# Patient Record
Sex: Male | Born: 1960 | Race: White | Marital: Married | Smoking: Never smoker
Health system: Southern US, Community
[De-identification: ages and names within clinical notes are randomized; demographics above are authoritative.]

## PROBLEM LIST (undated history)

## (undated) DIAGNOSIS — B882 Other arthropod infestations: Secondary | ICD-10-CM

---

## 2016-01-11 ENCOUNTER — Emergency Department
Admission: EM | Admit: 2016-01-11 | Discharge: 2016-01-11 | Disposition: A | Discharge status: Home / Self Care | Payer: 59 | Attending: Emergency Medicine | Admitting: Emergency Medicine

## 2016-01-11 ENCOUNTER — Encounter: Payer: Self-pay | Admitting: *Deleted

## 2016-01-11 ENCOUNTER — Emergency Department: Payer: 59

## 2016-01-11 DIAGNOSIS — R569 Unspecified convulsions: Secondary | ICD-10-CM | POA: Diagnosis present

## 2016-01-11 DIAGNOSIS — R51 Headache: Secondary | ICD-10-CM | POA: Insufficient documentation

## 2016-01-11 HISTORY — DX: Other arthropod infestations: B88.2

## 2016-01-11 LAB — COMPREHENSIVE METABOLIC PANEL
ALBUMIN: 4.1 g/dL (ref 3.5–5.0)
ALT: 19 U/L (ref 17–63)
ANION GAP: 8 (ref 5–15)
AST: 22 U/L (ref 15–41)
Alkaline Phosphatase: 53 U/L (ref 38–126)
BILIRUBIN TOTAL: 0.3 mg/dL (ref 0.3–1.2)
BUN: 11 mg/dL (ref 6–20)
CO2: 30 mmol/L (ref 22–32)
Calcium: 8.5 mg/dL — ABNORMAL LOW (ref 8.9–10.3)
Chloride: 96 mmol/L — ABNORMAL LOW (ref 101–111)
Creatinine, Ser: 1.06 mg/dL (ref 0.61–1.24)
GFR calc Af Amer: >60 mL/min (ref >60)
GFR calc non Af Amer: >60 mL/min (ref >60)
GLUCOSE: 119 mg/dL — AB (ref 65–99)
POTASSIUM: 4.3 mmol/L (ref 3.5–5.1)
SODIUM: 134 mmol/L — AB (ref 135–145)
TOTAL PROTEIN: 7.4 g/dL (ref 6.5–8.1)

## 2016-01-11 LAB — CBC
HEMATOCRIT: 42.1 % (ref 40.0–52.0)
Hemoglobin: 14.2 g/dL (ref 13.0–18.0)
MCH: 31.2 pg (ref 26.0–34.0)
MCHC: 33.7 g/dL (ref 32.0–36.0)
MCV: 92.5 fL (ref 80.0–100.0)
PLATELETS: 185 10*3/uL (ref 150–440)
RBC: 4.55 MIL/uL (ref 4.40–5.90)
RDW: 13.6 % (ref 11.5–14.5)
WBC: 10.2 10*3/uL (ref 3.8–10.6)

## 2016-01-11 LAB — TROPONIN I

## 2016-01-11 NOTE — ED Notes (Signed)
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

## 2016-01-11 NOTE — Discharge Instructions (Signed)
Seizure, Adult A seizure is abnormal electrical activity in the brain. Seizures usually last from 30 seconds to 2 minutes. There are various types of seizures. Before a seizure, you may have a warning sensation (aura) that a seizure is about to occur. An aura may include the following symptoms:   Fear or anxiety.  Nausea.  Feeling like the room is spinning (vertigo).  Vision changes, such as seeing flashing lights or spots. Common symptoms during a seizure include:  A change in attention or behavior (altered mental status).  Convulsions with rhythmic jerking movements.  Drooling.  Rapid eye movements.  Grunting.  Loss of bladder and bowel control.  Bitter taste in the mouth.  Tongue biting. After a seizure, you may feel confused and sleepy. You may also have an injury resulting from convulsions during the seizure. HOME CARE INSTRUCTIONS   If you are given medicines, take them exactly as prescribed by your health care provider.  Keep all follow-up appointments as directed by your health care provider.  Do not swim or drive or engage in risky activity during which a seizure could cause further injury to you or others until your health care provider says it is OK.  Get adequate rest.  Teach friends and family what to do if you have a seizure. They should:  Lay you on the ground to prevent a fall.  Put a cushion under your head.  Loosen any tight clothing around your neck.  Turn you on your side. If vomiting occurs, this helps keep your airway clear.  Stay with you until you recover.  Know whether or not you need emergency care. SEEK IMMEDIATE MEDICAL CARE IF:  The seizure lasts longer than 5 minutes.  The seizure is severe or you do not wake up immediately after the seizure.  You have an altered mental status after the seizure.  You are having more frequent or worsening seizures. Someone should drive you to the emergency department or call local emergency  services (911 in [place] MAKE SURE YOU:  Understand these instructions.  Will watch your condition.  Will get help right away if you are not doing well or get worse.   This information is not intended to replace advice given to you by your health care provider. Make sure you discuss any questions you have with your health care provider.   Document Released: 11/20/2000 Document Revised: 12/14/2014 Document Reviewed: 07/05/2013 Elsevier Interactive Patient Education [employer]   As I discussed with you the neurologist here is not sure if you had a seizure or you just passed out and had seizure-like activity because you are unable to lay down. That is more likely. CAT scan was negative labs looked normal including troponin EKG did not change over time I will ask you to follow up please with your regular doctor at home and he can continue the workup for the loss of consciousness and the seizure-like activity. I would not treat she was any antiseizure medicines at this time. However just in case for now have your wife do the driving until you see your doctor at home and if he wants to refer you to a neurologist that would probably be a good idea.

## 2016-01-11 NOTE — ED Provider Notes (Signed)
[facility] Emergency Department Provider Note  ____________________________________________  Time seen: Approximately 5:15 PM  I have reviewed the triage vital signs and the nursing notes.   HISTORY  Chief Complaint Seizures    HPI Wayne Best is a 55 y.o. male who was sitting on a plane with his wife he started to stare didn't respond and his head went back eyes rolled back and he had a fine tremor in his arms got quite sweaty when he came to he was glassy eye for 1 or 2 minutes and then was somewhat confused for about half an hour afterwards before came back himself. Patient was recently treated with bed be for bed the os is finished his antibiotics several days ago. Patient developed a gradual onset of a bad headache yesterday had it again this morning although it improved gradually over the course of the day and is now gone. She has had a cold and a cough for the last few days still going on now but it's better. Patient does not have any sinus tenderness no colored nasal drainage headache is was not worse when he bent forward patient doesn't get headaches often patient's never had a seizure before. The headache was mostly occipital although didn't radiate throughout his whole head was throbbing in nature   Past Medical History  Diagnosis Date  . Tick-borne disease     babeosis    There are no active problems to display for this patient.   History reviewed. No pertinent past surgical history.  No current outpatient prescriptions on file.  Allergies Review of patient's allergies indicates no known allergies.  History reviewed. No pertinent family history.  Social History Social History  Substance Use Topics  . Smoking status: Never Smoker   . Smokeless tobacco: None  . Alcohol Use: Yes    Review of Systems Constitutional: No fever/chills Eyes: No visual changes. ENT: No sore throat. Cardiovascular: Denies chest pain. Respiratory: Denies  shortness of breath. Gastrointestinal: No abdominal pain.  No nausea, no vomiting.  No diarrhea.  No constipation. Genitourinary: Negative for dysuria. Musculoskeletal: Negative for back pain. Skin: Negative for rash. Neurological: Negative for headaches except as noted in history of present illness, focal weakness or numbness. : Allergic/Immunilogical: **} 10-point ROS otherwise negative.  ____________________________________________   PHYSICAL EXAM:  VITAL SIGNS: ED Triage Vitals  Enc Vitals Group     BP 01/11/16 1518 151/70 mmHg     Pulse Rate 01/11/16 1518 79     Resp 01/11/16 1518 20     Temp 01/11/16 1518 99.7 F (37.6 C)     Temp Source 01/11/16 1518 Oral     SpO2 01/11/16 1518 97 %     Weight --      Height --      Head Cir --      Peak Flow --      Pain Score --      Pain Loc --      Pain Edu? --      Excl. in GC? --     Constitutional: Alert and oriented. Well appearing and in no acute distress. Eyes: Conjunctivae are normal. PERRL. EOMI. fundi are normal Head: Atraumatic. Nose: No congestion/rhinnorhea. Ears: Left TM is slightly red right ear canal is slightly red although the ear is not tender to traction or palpation Mouth/Throat: Mucous membranes are moist.  Oropharynx non-erythematous. Neck: No stridor. Neck is supple neck has been supple for some time patient that he uses presented  with neck pain and stiffness but that is has been going on for weeks. Cardiovascular: Normal rate, regular rhythm. Grossly normal heart sounds.  Good peripheral circulation. Respiratory: Normal respiratory effort.  No retractions. Lungs CTAB. Gastrointestinal: Soft and nontender. No distention. No abdominal bruits. No CVA tenderness. Musculoskeletal: No lower extremity tenderness nor edema.  No joint effusions. Neurologic:  Normal speech and language. No gross focal neurologic deficits are appreciated. Cranial nerves II through XII are intact cerebellar finger to nose and  rapid alternating movements are normal motor strength is 5 over 5 throughout sensation is intact throughout DTRs are equal and symmetrical throughout No gait instability. Skin:  Skin is warm, dry and intact. No rash noted. Psychiatric: Mood and affect are normal. Speech and behavior are normal.  ____________________________________________   LABS (all labs ordered are listed, but only abnormal results are displayed)  Labs Reviewed  COMPREHENSIVE METABOLIC PANEL - Abnormal; Notable for the following:    Sodium 134 (*)    Chloride 96 (*)    Glucose, Bld 119 (*)    Calcium 8.5 (*)    All other components within normal limits  CBC  TROPONIN I   ____________________________________________  EKG  EKG read and interpreted by me shows normal sinus rhythm rate of 84 there is some slight ST elevation in a few of the V leads which is probably consistent with early repolarization EKG #2 read and interpreted by me shows sinus rhythm at 78 there is no change from EKG #1 which was done almost an hour and a half earlier ____________________________________________  RADIOLOGY  CT read by radiology as no acute disease I reviewed the CT as well ____________________________________________   PROCEDURES  Discussed patient in detail with Dr. Thad Ranger she feels this was more likely to be syncope while seated and became bad because of his inability to lay down. Patient's regular doctor is in [place] we will provide a CD of the CT scan and copies of the lab work to the patient to take with him just in case patient's wife will do all the driving while her down here ____________________________________________   INITIAL IMPRESSION / ASSESSMENT AND PLAN / ED COURSE  Pertinent labs & imaging results that were available during my care of the patient were reviewed by me and considered in my medical decision making (see chart for details).   ____________________________________________   FINAL  CLINICAL IMPRESSION(S) / ED DIAGNOSES  Final diagnoses:  Seizure-like activity (HCC)      Arnaldo Natal, MD 01/11/16 [phone]

## 2016-01-11 NOTE — ED Notes (Addendum)
Pt ambulatory to triage.  Pt reports having a seizure on an airplane today.  Pt also states he had a headache since this morning took an advil without pain relief.  Wife states pt had an upper body seizure.  Pt did not bite tongue.  no urinary incontinence.  Pt alert   Speech clear.  No headache now.  No hx of seizures.

## 2016-01-11 NOTE — ED Notes (Signed)
Pt has been having a headache that started yesterday and last night with cough and congestion. Pt was asleep on the plane and woke up and staring, patients head went back and eyes rolled back and patient started jerking. Wife states patient was diaphoretic.  Pt has not history of seizures or similar episode. History per wife. Denies any urine incontinence.  Pt was in flight about 45 minutes when this happened.  Pt was disoriented for a bit after episode and was given oxygen mid flight.  Denies headache currently.

## 2017-03-13 IMAGING — CT CT HEAD W/O CM
2 series · 15 of 30 positions shown, 17 images · non-contrast
Comparison: None.

CLINICAL DATA: Seizure.

EXAM:
CT HEAD WITHOUT CONTRAST
TECHNIQUE: Contiguous axial images were obtained from the base of the skull
through the vertex without intravenous contrast.

[Series 2: head wo · axial · 0.44mm/px · z∈[+158,+273]mm · 7 of 31 slices shown, 9 images]
[im 4/31  brain]
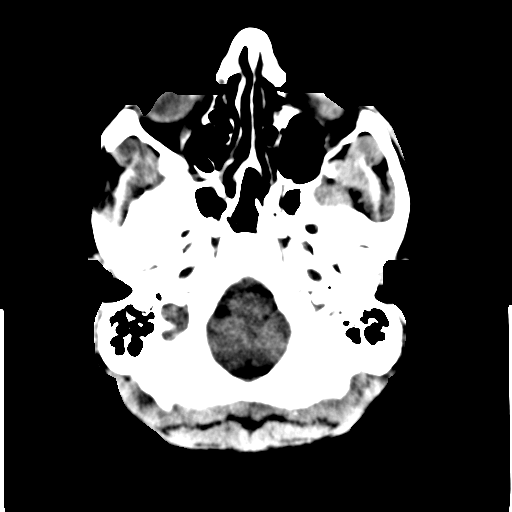
[im 4/31  bone]
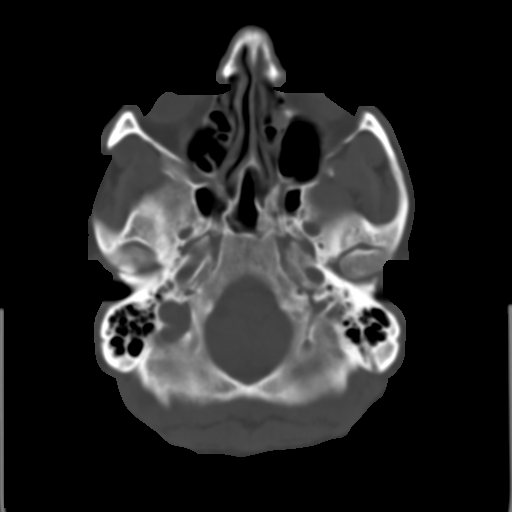
[im 8/31  brain]
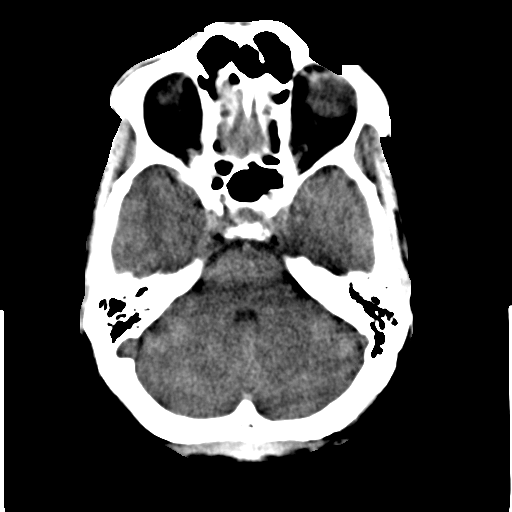
[im 12/31  brain]
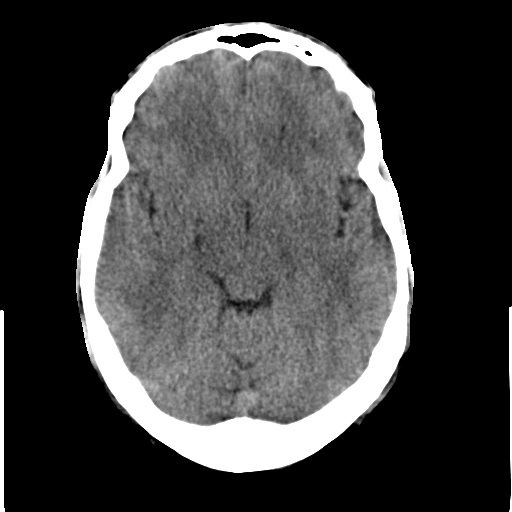
[im 16/31  brain]
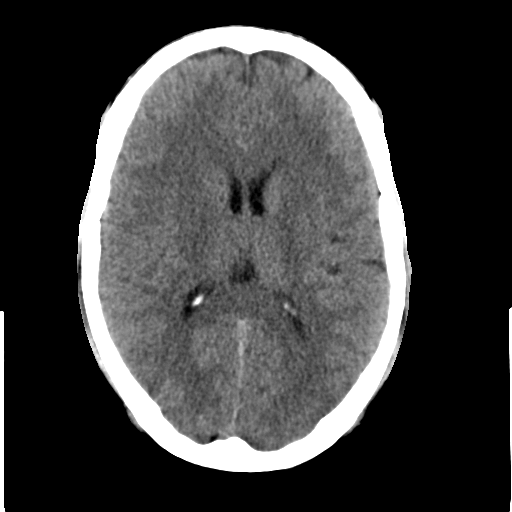
[im 19/31  brain]
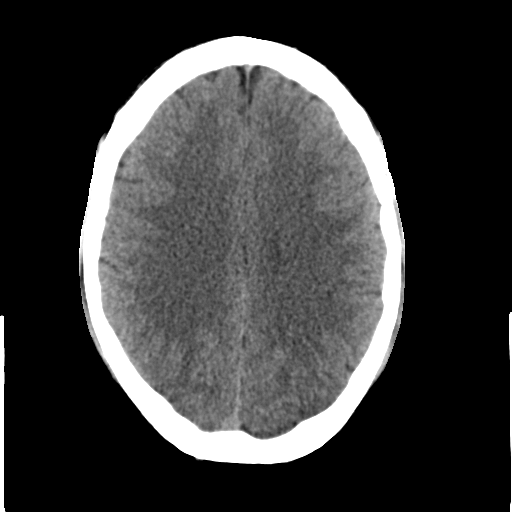
[im 19/31  bone]
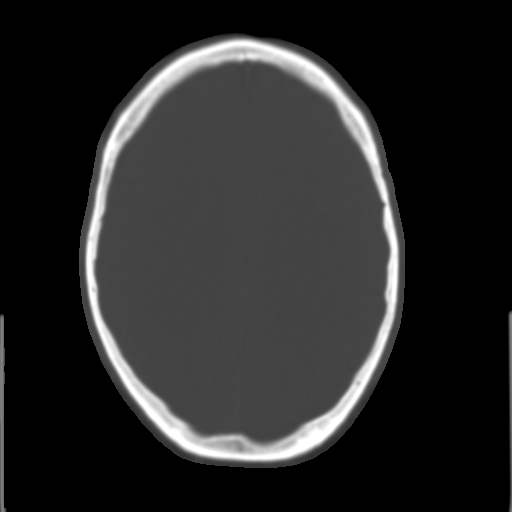
[im 23/31  brain]
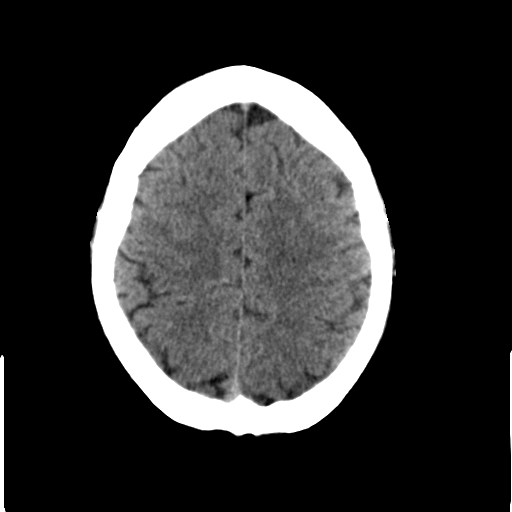
[im 27/31  brain]
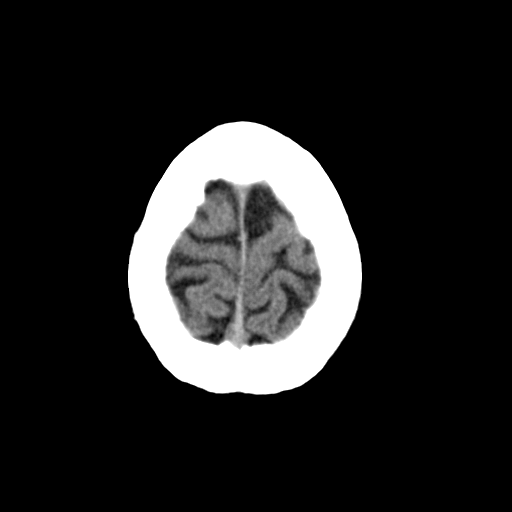

[Series 3: head bone · axial · 0.44mm/px · z∈[+157,+281]mm · 8 of 78 slices shown]
[im 8/78  bone]
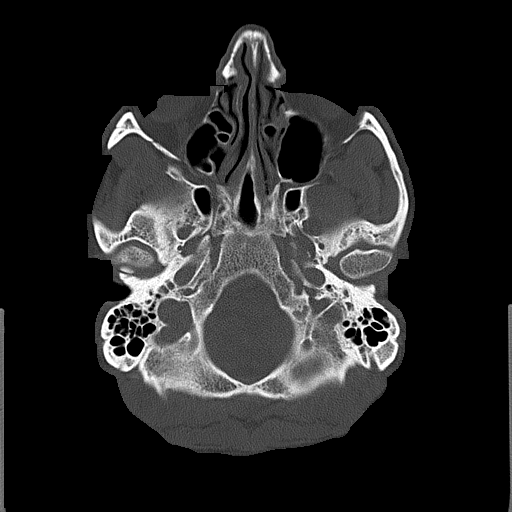
[im 16/78  bone]
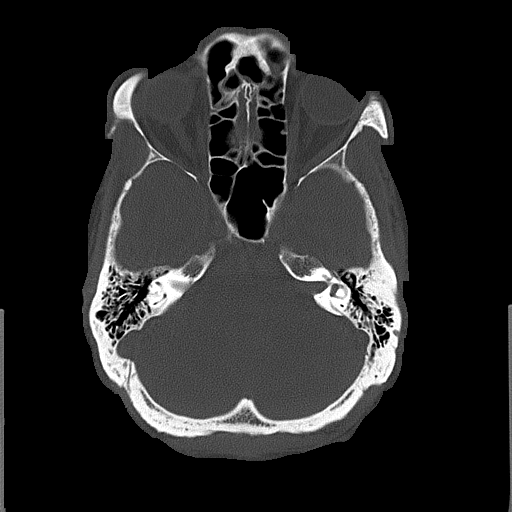
[im 24/78  bone]
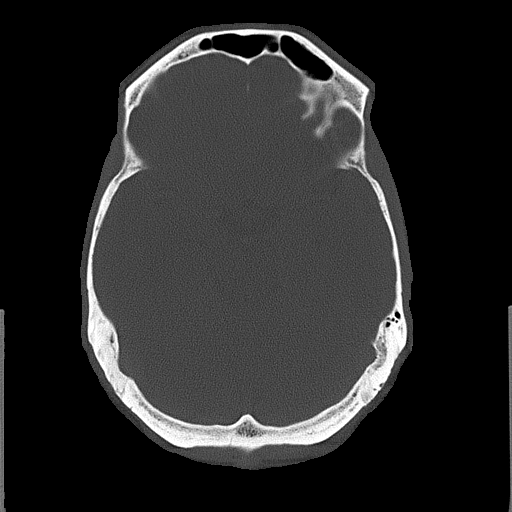
[im 35/78  bone]
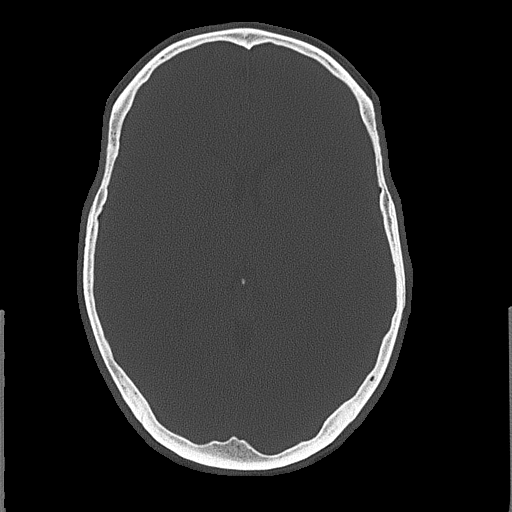
[im 43/78  bone]
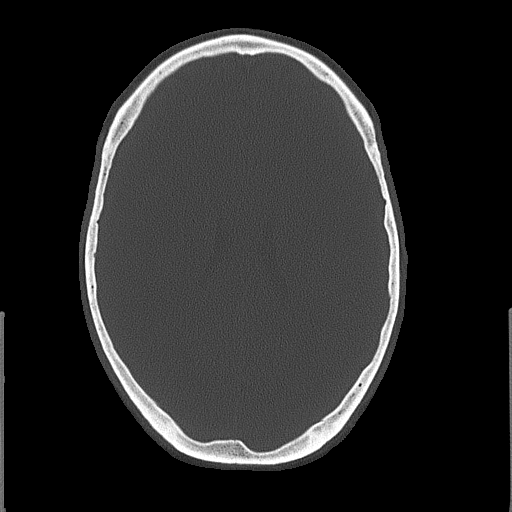
[im 54/78  bone]
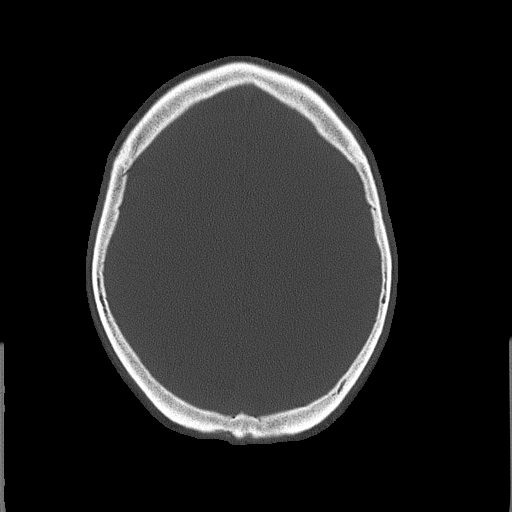
[im 62/78  bone]
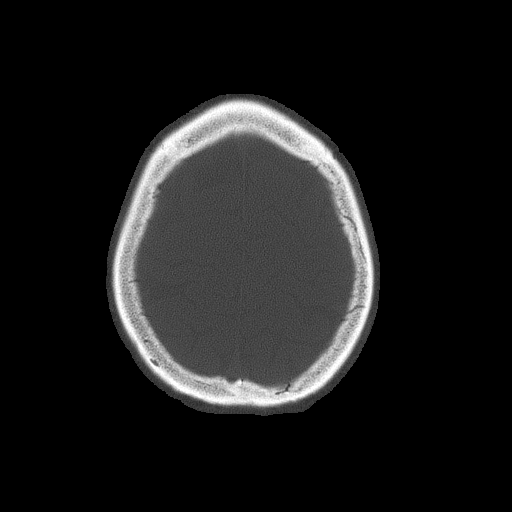
[im 70/78  bone]
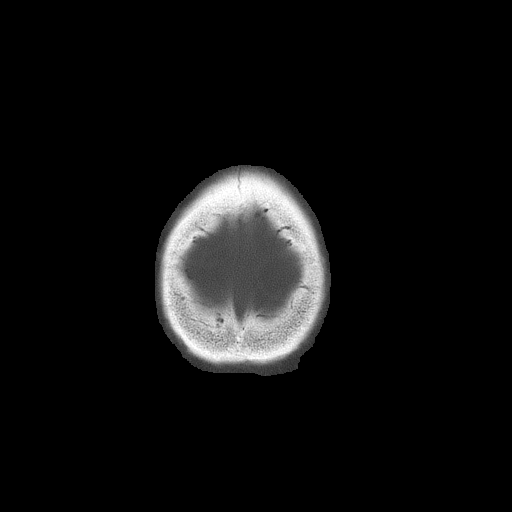

[15 of 30 positions shown; findings below may reference images not displayed]

FINDINGS: Bony calvarium appears intact. No mass effect or midline shift is
noted. Ventricular size is within normal limits. There is no
evidence of mass lesion, hemorrhage or acute infarction.
IMPRESSION: Normal head CT.
